# Patient Record
Sex: Male | Born: 1995 | Race: White | Hispanic: No | Marital: Single | Smoking: Never smoker
Health system: Southern US, Community
[De-identification: ages and names within clinical notes are randomized; demographics above are authoritative.]

## PROBLEM LIST (undated history)

## (undated) DIAGNOSIS — M419 Scoliosis, unspecified: Secondary | ICD-10-CM

## (undated) DIAGNOSIS — F909 Attention-deficit hyperactivity disorder, unspecified type: Secondary | ICD-10-CM

## (undated) HISTORY — PX: TONSILLECTOMY: SUR1361

---
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
## 2017-03-21 ENCOUNTER — Other Ambulatory Visit: Payer: Self-pay | Admitting: Gastroenterology

## 2017-03-21 DIAGNOSIS — K50819 Crohn's disease of both small and large intestine with unspecified complications: Secondary | ICD-10-CM

## 2017-03-21 DIAGNOSIS — R1084 Generalized abdominal pain: Secondary | ICD-10-CM

## 2017-03-24 ENCOUNTER — Ambulatory Visit
Admission: RE | Admit: 2017-03-24 | Discharge: 2017-03-24 | Disposition: A | Discharge status: Home / Self Care | Payer: Medicaid Other | Source: Ambulatory Visit | Attending: Gastroenterology | Admitting: Gastroenterology

## 2017-03-24 DIAGNOSIS — R1084 Generalized abdominal pain: Secondary | ICD-10-CM

## 2017-03-24 DIAGNOSIS — K50819 Crohn's disease of both small and large intestine with unspecified complications: Secondary | ICD-10-CM

## 2017-03-24 MED ORDER — IOPAMIDOL (ISOVUE-300) INJECTION 61%
100.0000 mL | Freq: Once | INTRAVENOUS | Status: AC | PRN
  Administered 2017-03-24: 100 mL via INTRAVENOUS

## 2017-12-12 IMAGING — CT CT ABD-PELV W/ CM
2 of 7 series · 13 of 36 positions shown, 18 images · IV contrast (READICAT/WATER & [ID] ISOVUE 300)
Comparison: None.

ADDENDUM:
Relative low attenuation within the head and body of the pancreas
described in the findings section is nonspecific but may represent
pancreatic edema. Recommend biochemical correlation for
pancreatitis. Consider autoimmune pancreatitis or other etiologies
of pancreatitis.

These results will be called to the ordering clinician or
representative by the Radiologist Assistant, and communication
documented in the PACS or zVision Dashboard.
CLINICAL DATA: Crohn's disease. Rectal surgery 2254. RIGHT lower
quadrant pain.
EXAM:
CT ABDOMEN AND PELVIS WITH CONTRAST
TECHNIQUE: Multidetector CT imaging of the abdomen and pelvis was performed
using the standard protocol following bolus administration of
intravenous contrast.
CONTRAST:  100mL BKO1WK-WII IOPAMIDOL (BKO1WK-WII) INJECTION 61%

[Series 3: abd/pelvis with · axial · 0.57mm/px · z∈[-386,-136]mm · 5 of 76 slices shown, 10 images]
[im 13/76  soft-tissue]
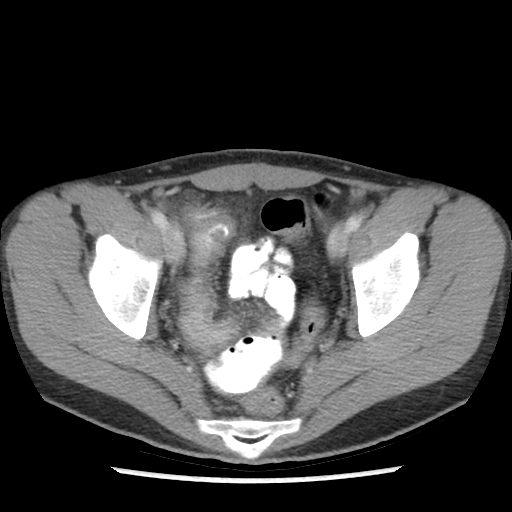
[im 13/76  bone]
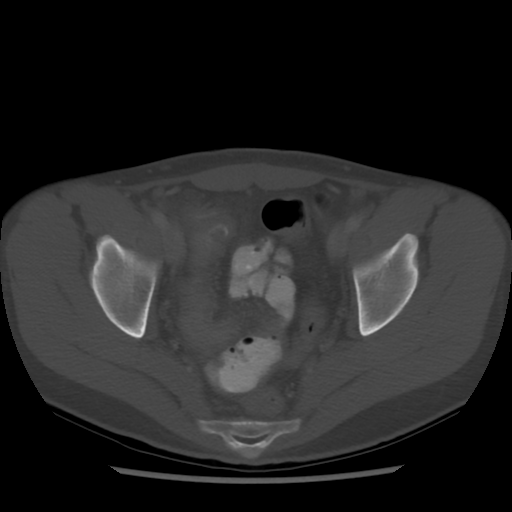
[im 26/76  soft-tissue]
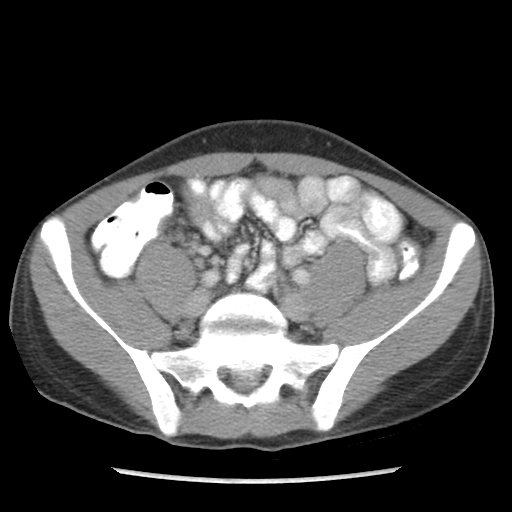
[im 26/76  lung]
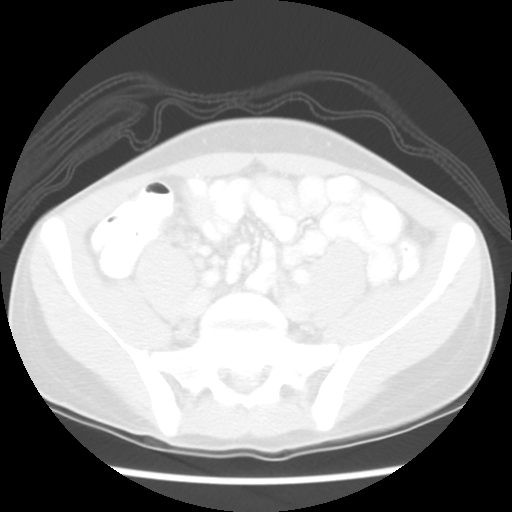
[im 38/76  soft-tissue]
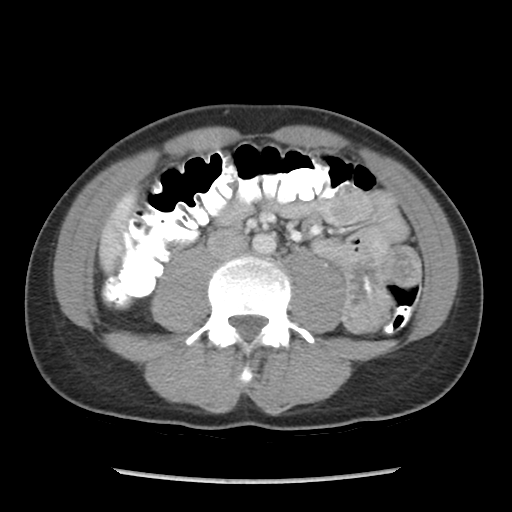
[im 38/76  lung]
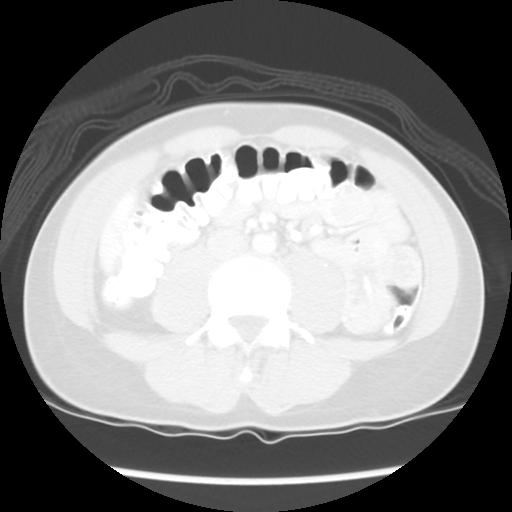
[im 51/76  soft-tissue]
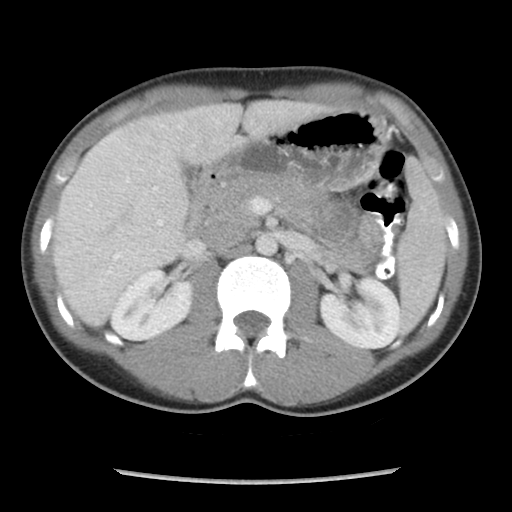
[im 51/76  lung]
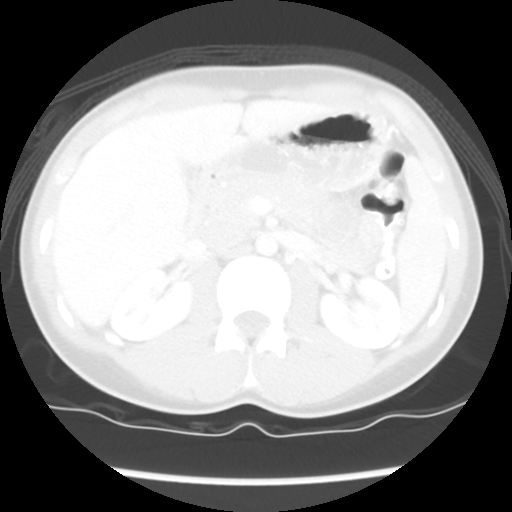
[im 63/76  soft-tissue]
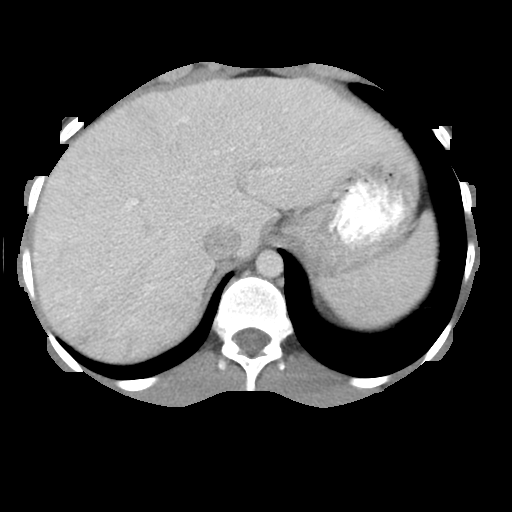
[im 63/76  lung]
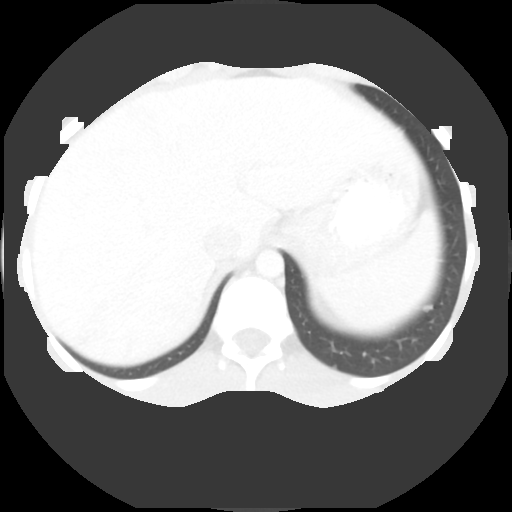

[Series 602: sagittal body · sagittal · 0.74mm/px · 8 of 119 slices shown]
[im 12/119  soft-tissue]
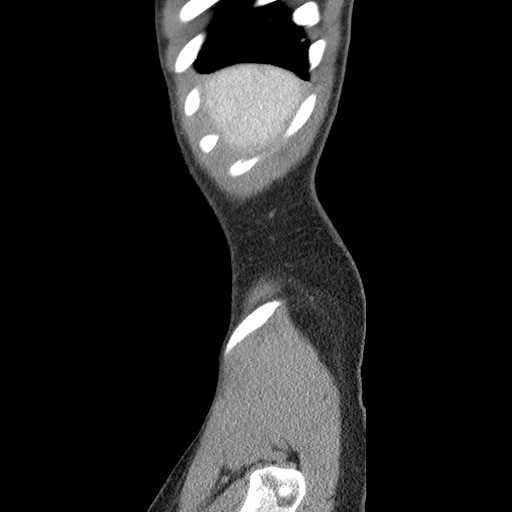
[im 24/119  soft-tissue]
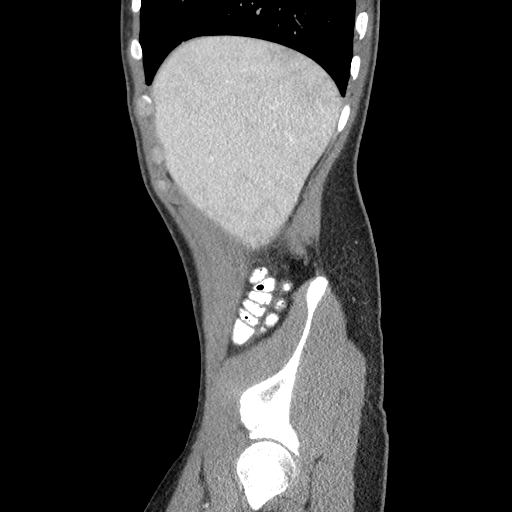
[im 36/119  soft-tissue]
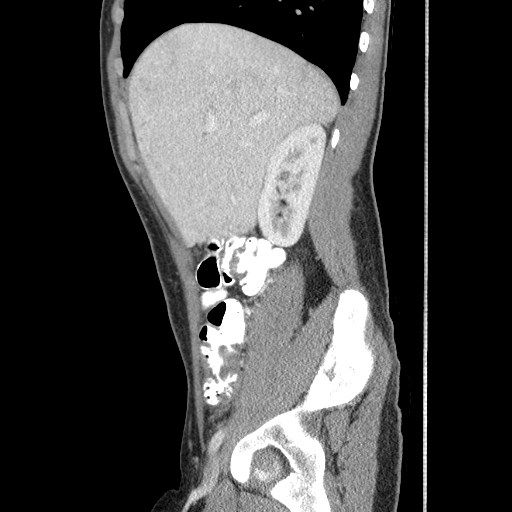
[im 48/119  soft-tissue]
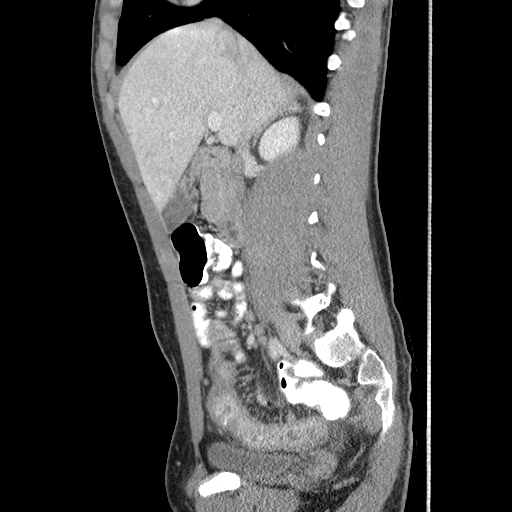
[im 71/119  soft-tissue]
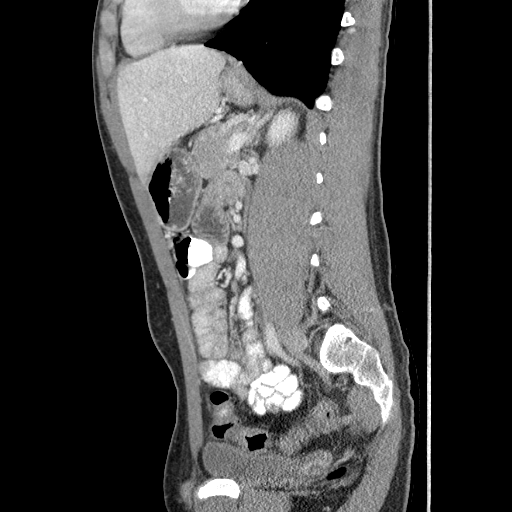
[im 83/119  soft-tissue]
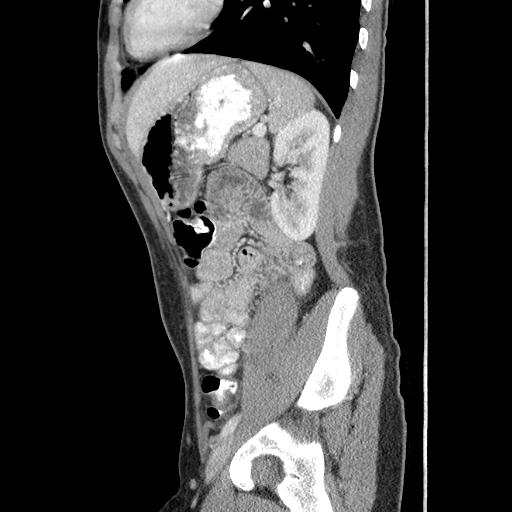
[im 95/119  soft-tissue]
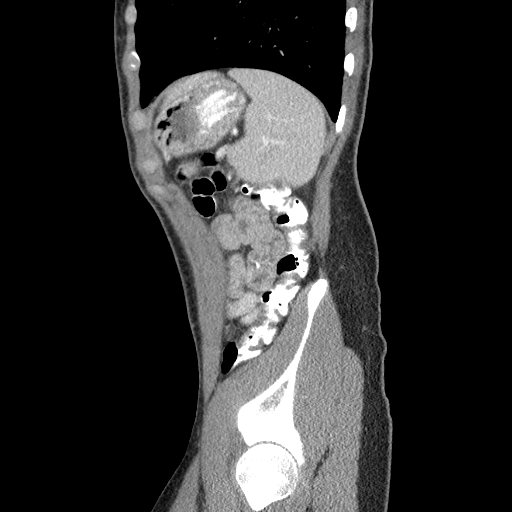
[im 107/119  soft-tissue]
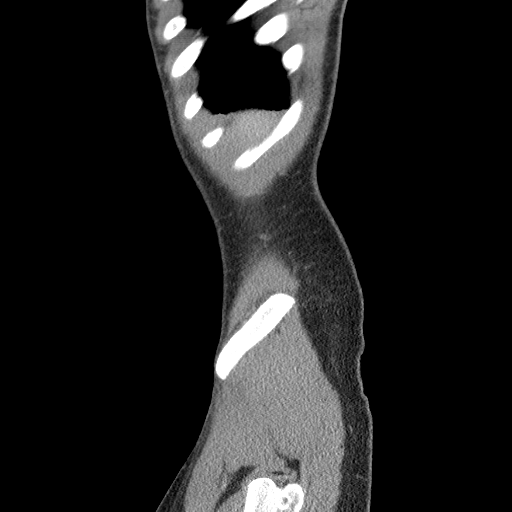

[13 of 36 positions shown; findings below may reference images not displayed]

FINDINGS: Lower chest: Lung bases are clear.

Hepatobiliary: No focal hepatic lesion. No biliary duct dilatation.
Gallbladder is normal. Common bile duct is normal.

Pancreas: There is relative decreased attenuation within the
pancreatic parenchyma of the head and body. The tail is more normal
density. The findings suggest mild edema within the pancreas. There
is no peripancreatic fat inflammation. No duct dilatation.

Spleen: Normal spleen

Adrenals/urinary tract: Adrenal glands and kidneys are normal. The
ureters and bladder normal.

Stomach/Bowel: Stomach and duodenum are normal. The jejunum has
normal mucosal pattern. The proximal ileum appears normal. The
distal ileum demonstrates a long segment luminal narrowing
circumferential bowel wall thickening. For example there bowel wall
thickening and luminal narrowing in loop of ileum within the deep
pelvis measuring 11 mm single wall thickness (image 66, series 3.
There is mild pre stenotic dilatation of the bowel to 2.5 cm on the
same image. The this occurs approximately 12 cm from the terminal
ileum. The intervening bowel leading up to the terminal ileum is a
diffusely thickened with single wall thickness for 11 mm (image 61,
series 3). The thickening extends the terminal ileum.

There is no fistula or abscess present.

The appendix is normal. The ascending, transverse and descending
colon normal.

Small amount free fluid in the pelvis.

Vascular/Lymphatic: Abdominal aorta is normal caliber. There is no
retroperitoneal or periportal lymphadenopathy. No pelvic
lymphadenopathy.

Reproductive: Prostate normal

Other: No free fluid.

Musculoskeletal: No aggressive osseous lesion.
IMPRESSION: 1. Long segment of circumferential bowel wall thickening involving
approximately 12 cm of the distal ileum up to the terminal ileum
with single wall thickness up to 1 cm. Mild pre stenotic dilatation
of the small bowel. No evidence obstruction. Mild perienteric
inflammation.
2. The stomach, duodenum and jejunum are normal.  Colon is normal.
3. Small amount free fluid the pelvis.  No fistula or abscess.
